# Patient Record
Sex: Female | Born: 1985 | Race: White | Hispanic: No
Health system: Southern US, Community
[De-identification: ages and names within clinical notes are randomized; demographics above are authoritative.]

---

## 2009-09-03 ENCOUNTER — Emergency Department (HOSPITAL_COMMUNITY): Admission: EM | Admit: 2009-09-03 | Discharge: 2009-09-03 | Payer: Self-pay | Admitting: Emergency Medicine

## 2010-01-05 ENCOUNTER — Emergency Department (HOSPITAL_COMMUNITY)
Admission: EM | Admit: 2010-01-05 | Discharge: 2010-01-05 | Payer: Self-pay | Source: Home / Self Care | Admitting: Emergency Medicine

## 2010-04-03 LAB — POCT PREGNANCY, URINE: Preg Test, Ur: NEGATIVE
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
## 2018-09-22 ENCOUNTER — Other Ambulatory Visit: Payer: Self-pay

## 2018-09-22 DIAGNOSIS — Z20822 Contact with and (suspected) exposure to covid-19: Secondary | ICD-10-CM

## 2018-09-24 ENCOUNTER — Telehealth: Payer: Self-pay

## 2018-09-24 LAB — NOVEL CORONAVIRUS, NAA: SARS-CoV-2, NAA: NOT DETECTED

## 2018-09-24 NOTE — Telephone Encounter (Signed)
Provided Covid -19 Lab results to [facility] voiced understanding.

## 2018-10-13 ENCOUNTER — Other Ambulatory Visit: Payer: Self-pay

## 2018-10-13 DIAGNOSIS — Z20822 Contact with and (suspected) exposure to covid-19: Secondary | ICD-10-CM

## 2018-10-14 LAB — NOVEL CORONAVIRUS, NAA: SARS-CoV-2, NAA: NOT DETECTED

## 2018-11-29 ENCOUNTER — Other Ambulatory Visit: Payer: Self-pay

## 2018-11-29 DIAGNOSIS — Z20822 Contact with and (suspected) exposure to covid-19: Secondary | ICD-10-CM

## 2018-12-02 LAB — NOVEL CORONAVIRUS, NAA: SARS-CoV-2, NAA: NOT DETECTED
